# Patient Record
Sex: Male | Born: 1995 | Race: White | Hispanic: No | Marital: Single | Smoking: Never smoker
Health system: Southern US, Community
[De-identification: ages and names within clinical notes are randomized; demographics above are authoritative.]

## PROBLEM LIST (undated history)

## (undated) HISTORY — PX: VASECTOMY: SHX75

---
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
## 2016-03-01 ENCOUNTER — Ambulatory Visit (INDEPENDENT_AMBULATORY_CARE_PROVIDER_SITE_OTHER): Payer: PRIVATE HEALTH INSURANCE | Admitting: Psychiatry

## 2016-03-01 ENCOUNTER — Encounter (HOSPITAL_COMMUNITY): Payer: Self-pay | Admitting: Psychiatry

## 2016-03-01 ENCOUNTER — Encounter (INDEPENDENT_AMBULATORY_CARE_PROVIDER_SITE_OTHER): Payer: Self-pay

## 2016-03-01 VITALS — BP 116/74 | HR 72 | Ht 67.25 in | Wt 146.4 lb

## 2016-03-01 DIAGNOSIS — F4323 Adjustment disorder with mixed anxiety and depressed mood: Secondary | ICD-10-CM

## 2016-03-01 DIAGNOSIS — F1099 Alcohol use, unspecified with unspecified alcohol-induced disorder: Secondary | ICD-10-CM | POA: Diagnosis not present
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
## 2021-09-01 ENCOUNTER — Emergency Department
Admission: EM | Admit: 2021-09-01 | Discharge: 2021-09-01 | Disposition: A | Discharge status: Home / Self Care | Payer: 59 | Attending: Emergency Medicine | Admitting: Emergency Medicine

## 2021-09-01 ENCOUNTER — Emergency Department: Payer: 59

## 2021-09-01 ENCOUNTER — Other Ambulatory Visit: Payer: Self-pay

## 2021-09-01 DIAGNOSIS — R200 Anesthesia of skin: Secondary | ICD-10-CM | POA: Insufficient documentation

## 2021-09-01 DIAGNOSIS — R42 Dizziness and giddiness: Secondary | ICD-10-CM | POA: Diagnosis not present

## 2021-09-01 DIAGNOSIS — R202 Paresthesia of skin: Secondary | ICD-10-CM | POA: Diagnosis present

## 2021-09-01 LAB — DIFFERENTIAL
Abs Immature Granulocytes: 0.03 10*3/uL (ref 0.00–0.07)
Basophils Absolute: 0.1 10*3/uL (ref 0.0–0.1)
Basophils Relative: 1 %
Eosinophils Absolute: 0.2 10*3/uL (ref 0.0–0.5)
Eosinophils Relative: 3 %
Immature Granulocytes: 0 %
Lymphocytes Relative: 30 %
Lymphs Abs: 2.3 10*3/uL (ref 0.7–4.0)
Monocytes Absolute: 0.6 10*3/uL (ref 0.1–1.0)
Monocytes Relative: 8 %
Neutro Abs: 4.7 10*3/uL (ref 1.7–7.7)
Neutrophils Relative %: 58 %

## 2021-09-01 LAB — COMPREHENSIVE METABOLIC PANEL
ALT: 60 U/L — ABNORMAL HIGH (ref 0–44)
AST: 31 U/L (ref 15–41)
Albumin: 4.8 g/dL (ref 3.5–5.0)
Alkaline Phosphatase: 81 U/L (ref 38–126)
Anion gap: 8 (ref 5–15)
BUN: 17 mg/dL (ref 6–20)
CO2: 28 mmol/L (ref 22–32)
Calcium: 9.7 mg/dL (ref 8.9–10.3)
Chloride: 104 mmol/L (ref 98–111)
Creatinine, Ser: 0.94 mg/dL (ref 0.61–1.24)
GFR, Estimated: >60 mL/min (ref >60)
Glucose, Bld: 105 mg/dL — ABNORMAL HIGH (ref 70–99)
Potassium: 3.7 mmol/L (ref 3.5–5.1)
Sodium: 140 mmol/L (ref 135–145)
Total Bilirubin: 0.8 mg/dL (ref 0.3–1.2)
Total Protein: 8.2 g/dL — ABNORMAL HIGH (ref 6.5–8.1)

## 2021-09-01 LAB — CBC
HCT: 47 % (ref 39.0–52.0)
Hemoglobin: 15.5 g/dL (ref 13.0–17.0)
MCH: 27.9 pg (ref 26.0–34.0)
MCHC: 33 g/dL (ref 30.0–36.0)
MCV: 84.5 fL (ref 80.0–100.0)
Platelets: 317 10*3/uL (ref 150–400)
RBC: 5.56 MIL/uL (ref 4.22–5.81)
RDW: 13 % (ref 11.5–15.5)
WBC: 7.9 10*3/uL (ref 4.0–10.5)
nRBC: 0 % (ref 0.0–0.2)

## 2021-09-01 LAB — APTT: aPTT: 27 seconds (ref 24–36)

## 2021-09-01 LAB — PROTIME-INR
INR: 1 (ref 0.8–1.2)
Prothrombin Time: 13.1 seconds (ref 11.4–15.2)

## 2021-09-01 IMAGING — CT CT HEAD W/O CM
4 series · 16 of 47 positions shown, 18 images · non-contrast
Comparison: [DATE].

CLINICAL DATA: Patient complains of waking with numbness and
tingling in left side of face, arms and legs this morning.



[Series 2: head wo · axial · 0.40mm/px · z∈[-106,+10]mm · 7 of 31 slices shown, 9 images]
[im 4/31  brain]
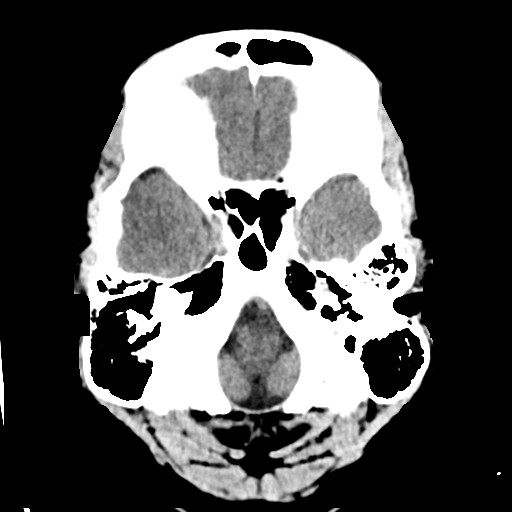
[im 4/31  bone]
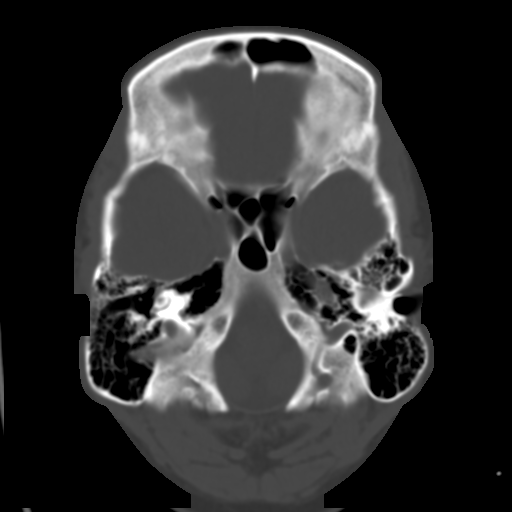
[im 8/31  brain]
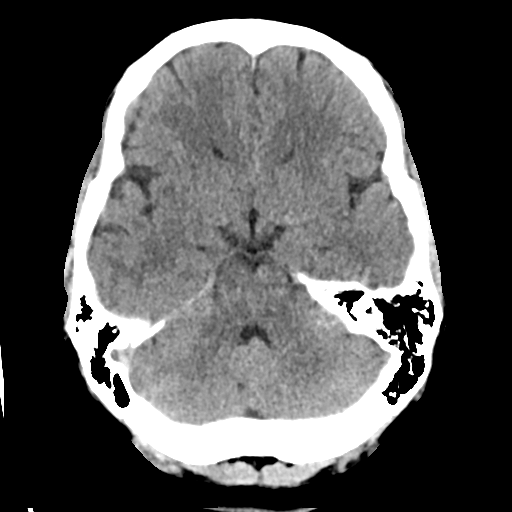
[im 12/31  brain]
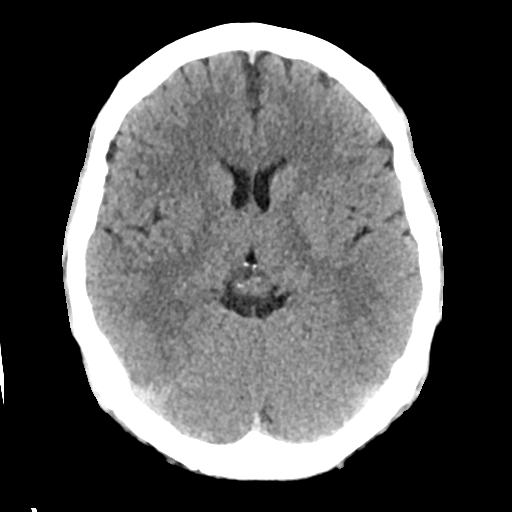
[im 16/31  brain]
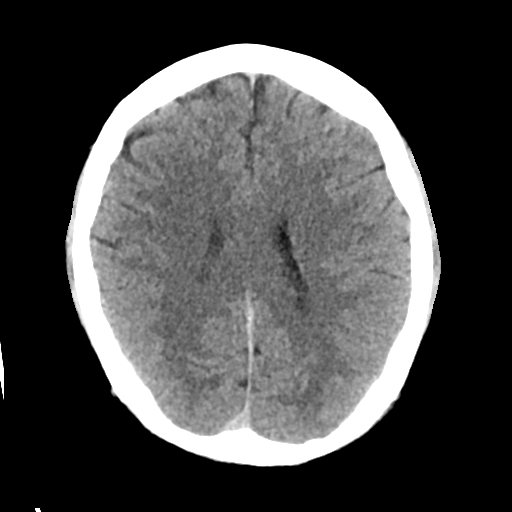
[im 19/31  brain]
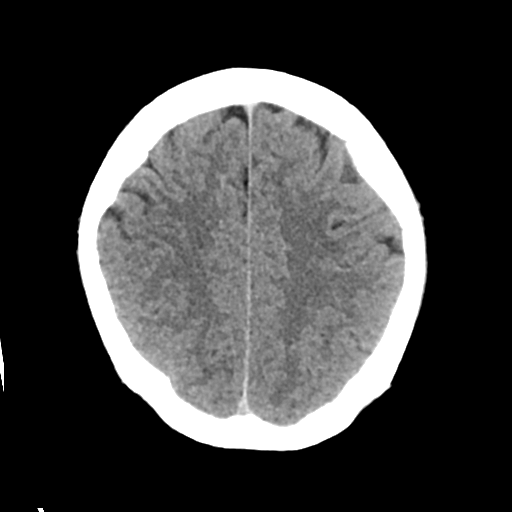
[im 19/31  bone]
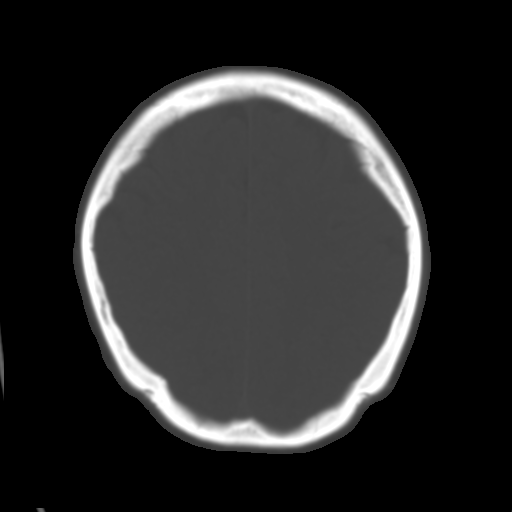
[im 23/31  brain]
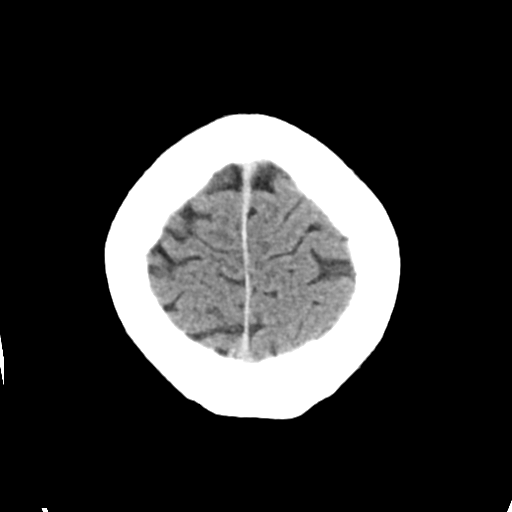
[im 27/31  brain]
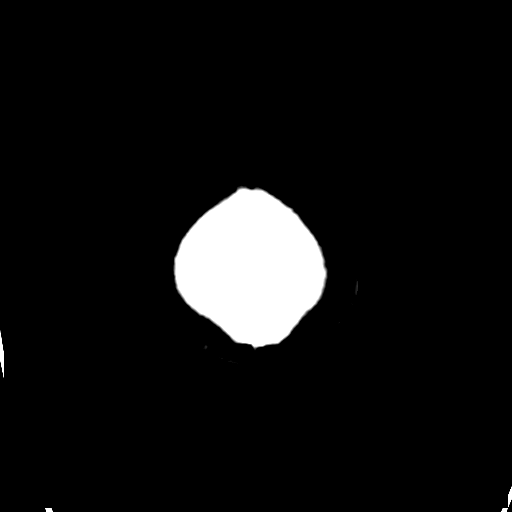

[Series 3: head bone · axial · 0.40mm/px · z∈[-106,-76]mm · 3 of 76 slices shown]
[im 8/76  bone]
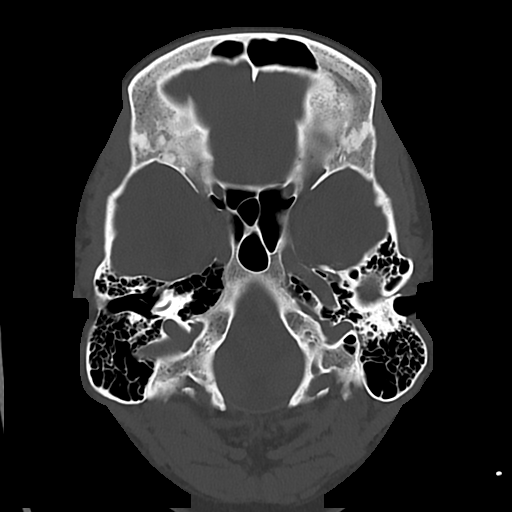
[im 16/76  bone]
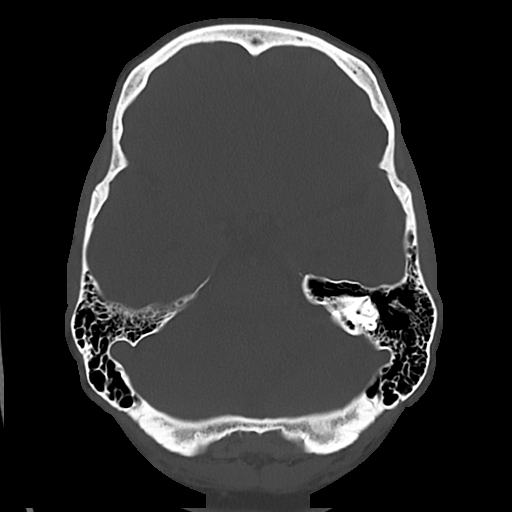
[im 23/76  bone]
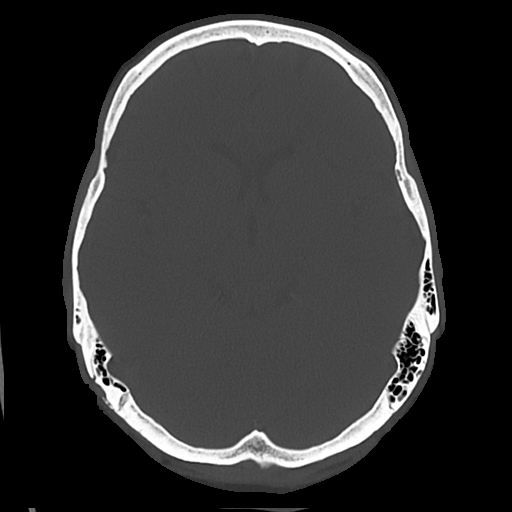

[Series 4: cor soft · coronal · 0.33mm/px · 3 of 67 slices shown]
[im 23/67  brain]
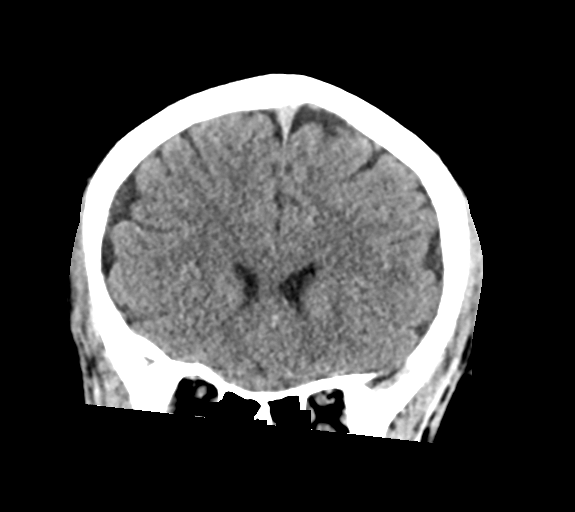
[im 30/67  brain]
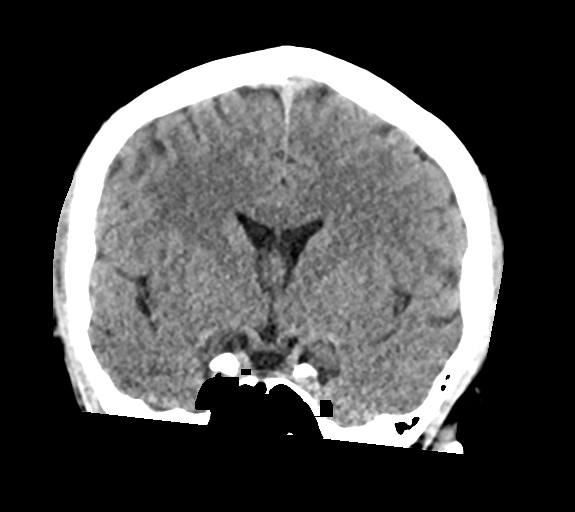
[im 37/67  brain]
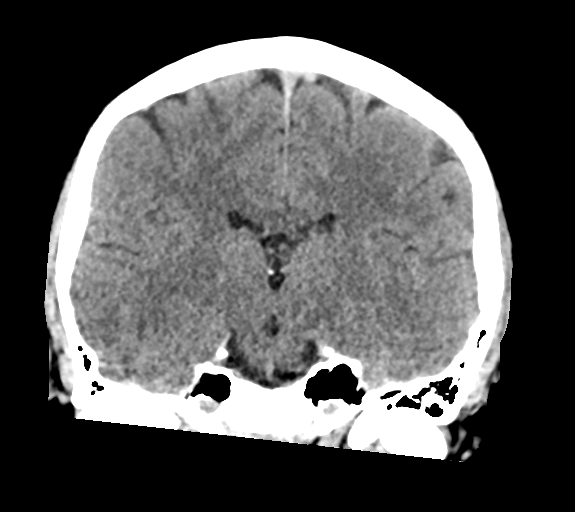

[Series 5: sag soft · sagittal · 0.33mm/px · 3 of 64 slices shown]
[im 23/64  brain]
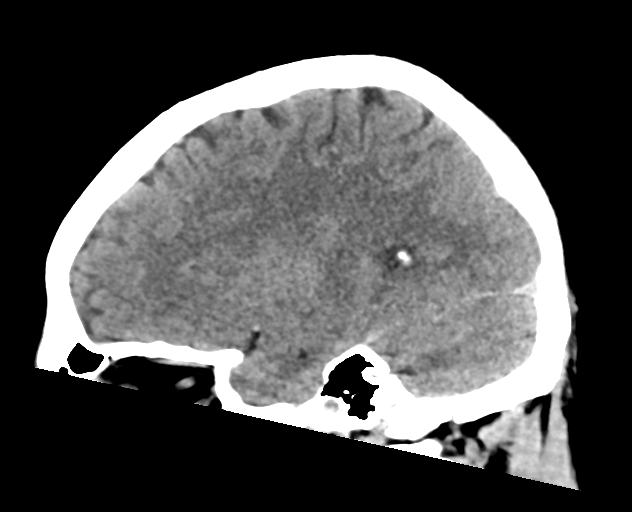
[im 32/64  brain]
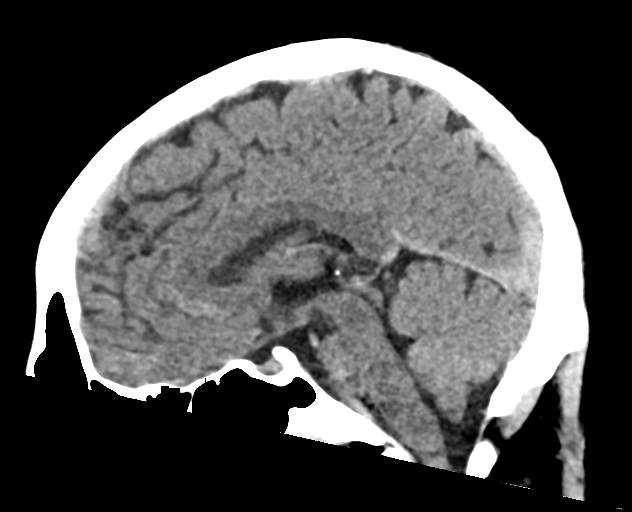
[im 42/64  brain]
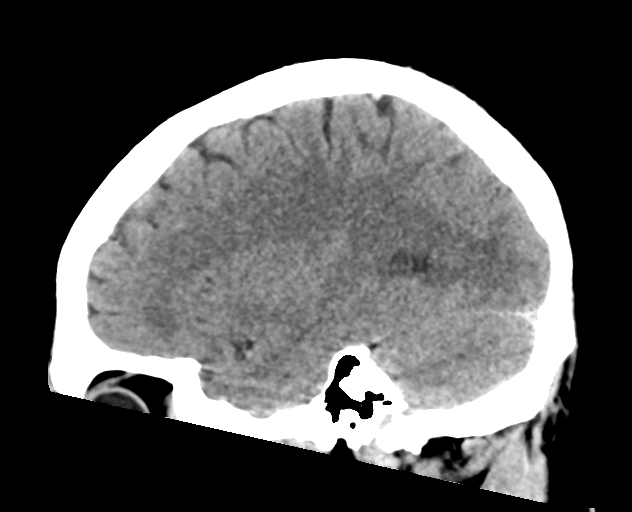

[16 of 47 positions shown; findings below may reference images not displayed]

FINDINGS: Brain: No evidence of acute infarction, hemorrhage, hydrocephalus,
extra-axial collection or mass lesion/mass effect.

Vascular: No hyperdense vessel or unexpected calcification.

Skull: Normal. Negative for fracture or focal lesion.

Sinuses/Orbits: No acute finding.

Other: None.
IMPRESSION: No acute intracranial abnormalities. Normal appearance of the brain.

## 2021-09-01 IMAGING — MR MR CERVICAL SPINE WO/W CM
5 of 8 series · 28 of 48 positions shown · IV contrast (gadavist)
Comparison: None.

CLINICAL DATA: Neuro deficit, acute, stroke suspected Acute L face,
arm, leg numbness in otherwise healthy young male, evaluate stroke,
MS, mass/lesion; Myelopathy, acute, cervical spine

EXAM:
MRI HEAD WITHOUT AND WITH CONTRAST
MRI CERVICAL SPINE WITHOUT AND WITH CONTRAST
CONTRAST:  7.5mL GADAVIST GADOBUTROL 1 MMOL/ML IV SOLN
TECHNIQUE: Multiplanar, multiecho pulse sequences of the brain and surrounding
structures and cervical spine were obtained without and with
intravenous contrast.

[Series 5: T2 · sagittal · 3.0mm · 0.62mm/px · 4 of 15 slices shown (1 of 2)]
[im 1/15]
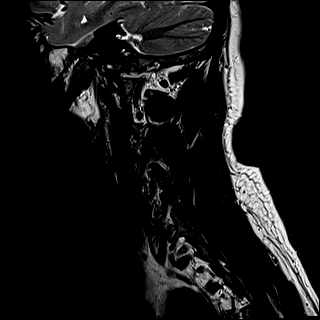
[im 5/15]
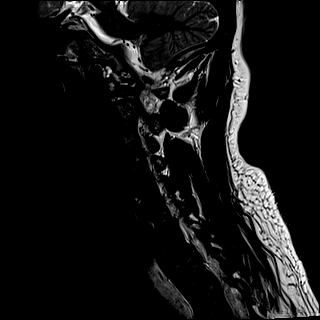
[im 10/15]
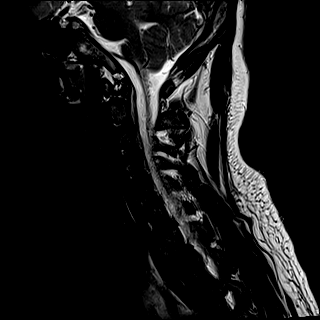
[im 15/15]
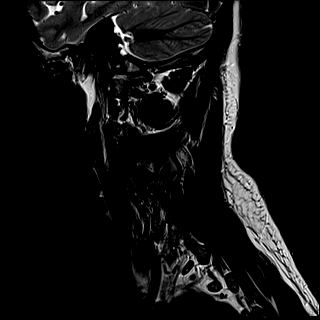

[Series 7: STIR · sagittal · 3.0mm · 0.62mm/px · 4 of 15 slices shown]
[im 1/15]
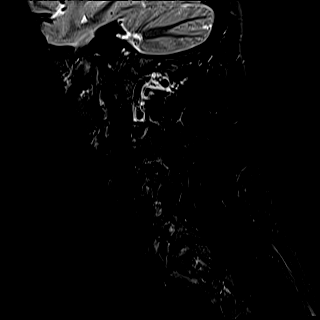
[im 5/15]
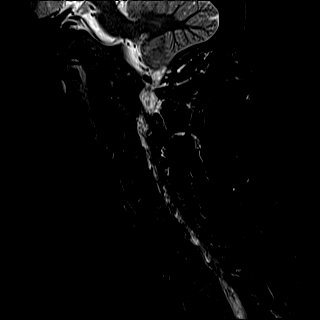
[im 10/15]
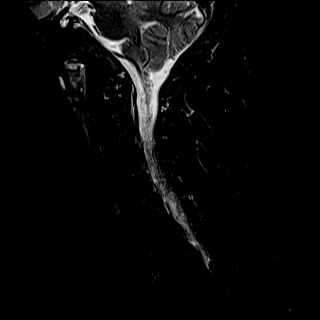
[im 15/15]
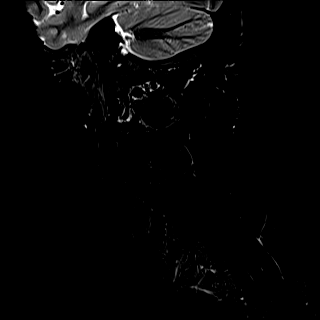

[Series 8: T2 · axial · 3.0mm · 0.70mm/px · z∈[-209,-108]mm · 8 of 33 slices shown (2 of 2)]
[im 1/33]
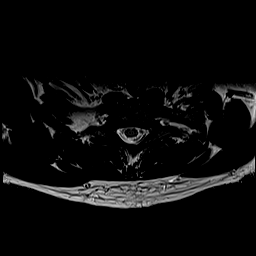
[im 5/33]
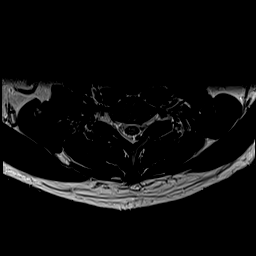
[im 10/33]
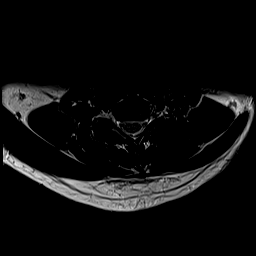
[im 14/33]
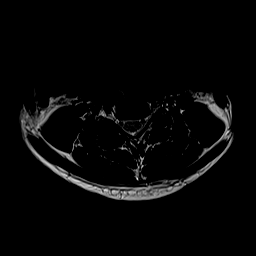
[im 19/33]
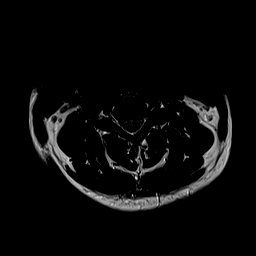
[im 23/33]
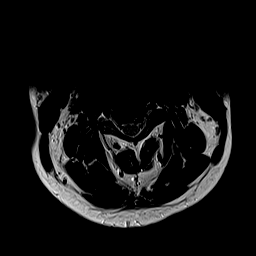
[im 28/33]
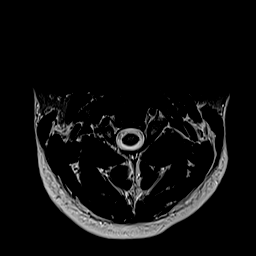
[im 33/33]
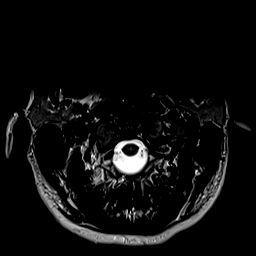

[Series 10: T1 · axial · non-contrast · 3.0mm · 0.35mm/px · z∈[-209,-108]mm · 8 of 33 slices shown]
[im 1/33]
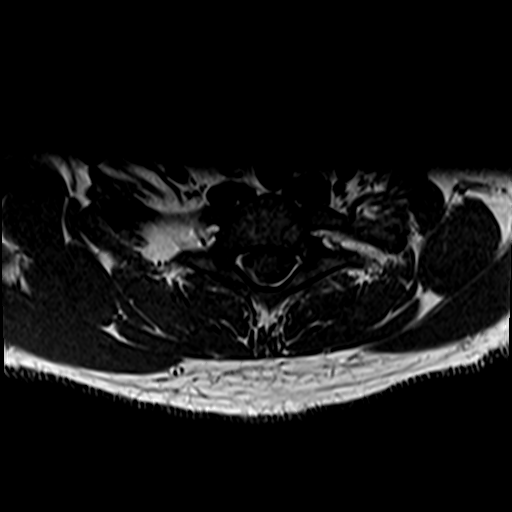
[im 5/33]
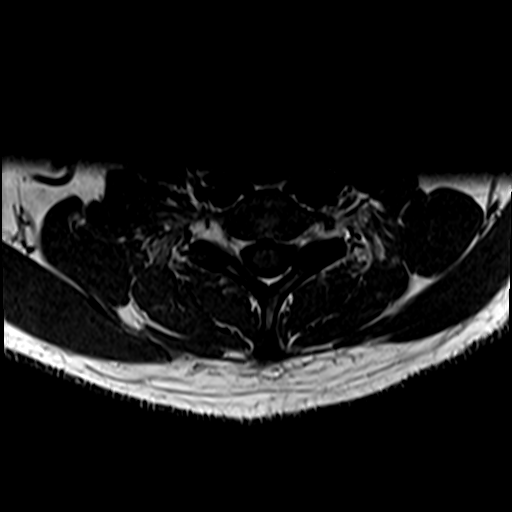
[im 10/33]
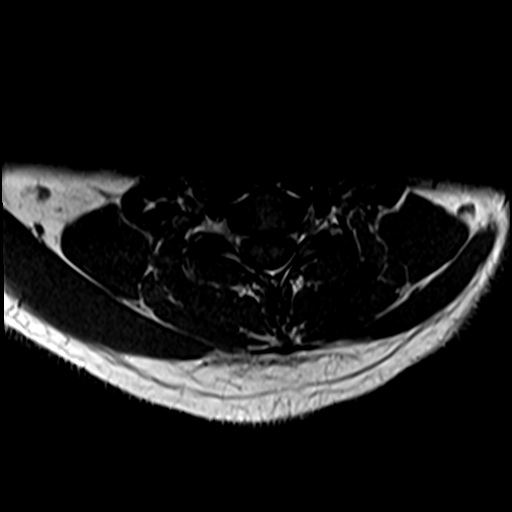
[im 14/33]
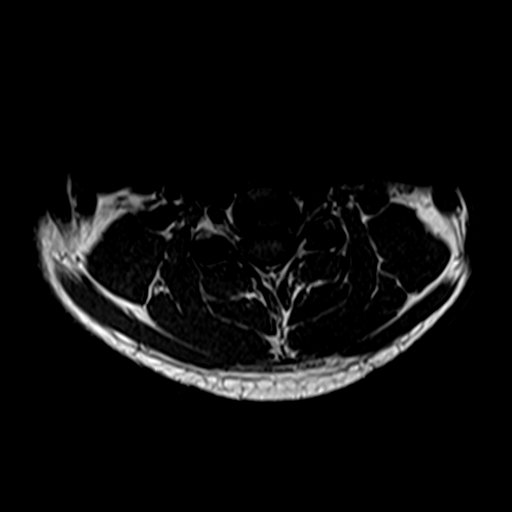
[im 19/33]
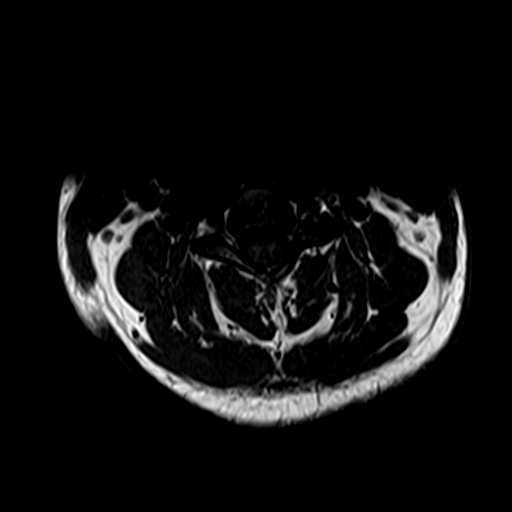
[im 23/33]
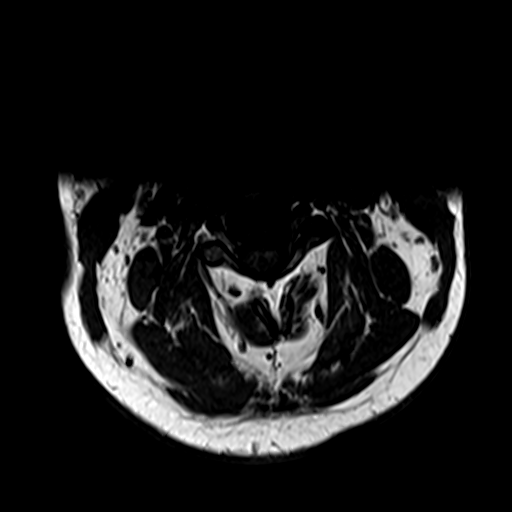
[im 28/33]
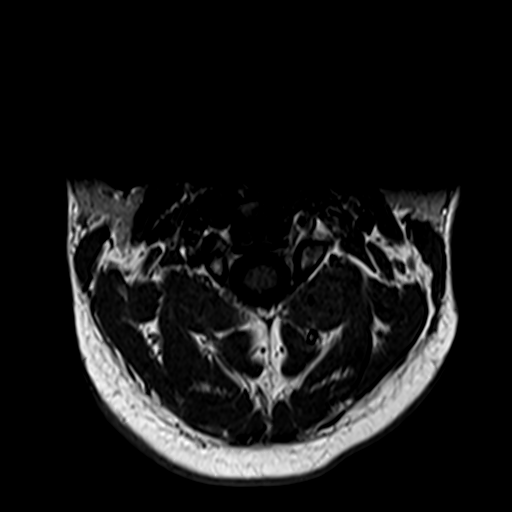
[im 33/33]
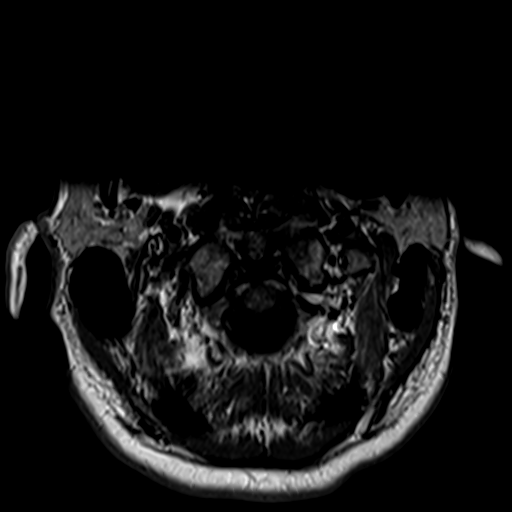

[Series 12: T1 post-contrast · axial · 3.0mm · 0.35mm/px · z∈[-209,-168]mm · 4 of 33 slices shown]
[im 1/33]
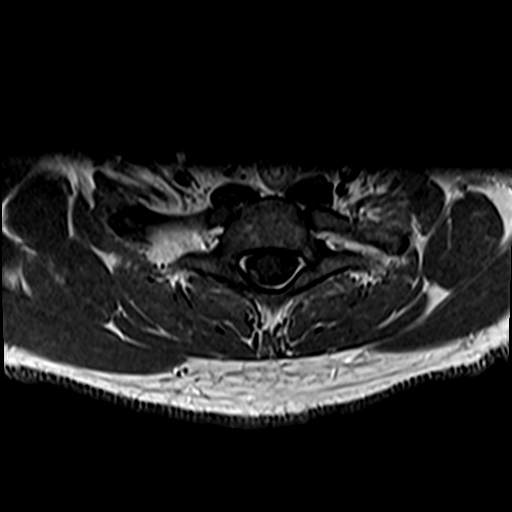
[im 5/33]
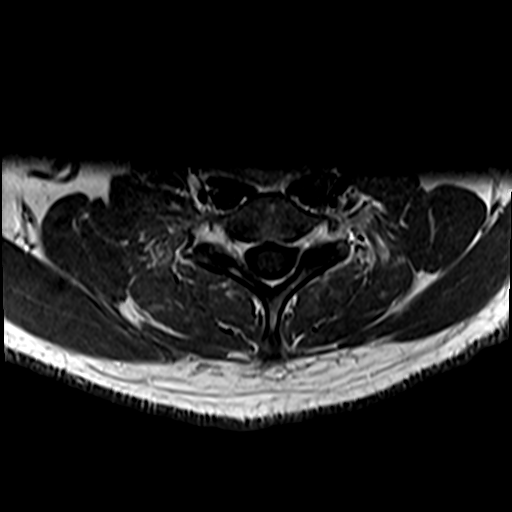
[im 10/33]
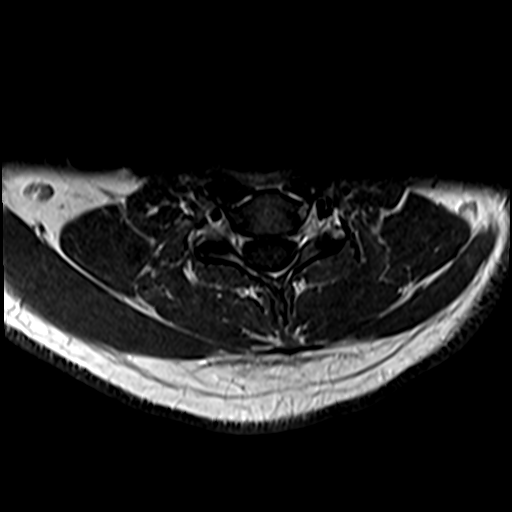
[im 14/33]
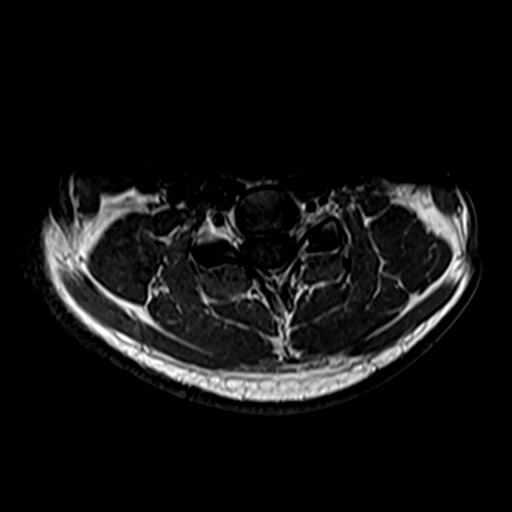

[28 of 48 positions shown; findings below may reference images not displayed]

FINDINGS: MRI HEAD FINDINGS

Brain: No acute infarction, hemorrhage, hydrocephalus, extra-axial
collection or mass lesion. Minimal punctate T2/FLAIR
hyperintensities in the white matter. No abnormal enhancement.

Vascular: Major arterial flow voids are maintained at the skull
base.

Skull and upper cervical spine: Normal marrow signal.

Sinuses/Orbits: Mild paranasal sinus mucosal thickening. No acute
orbital findings.

Other: No mastoid effusions.

MRI CERVICAL SPINE FINDINGS

Alignment: Normal.

Vertebrae: Vertebral body heights are maintained. No focal marrow
edema to suggest acute fracture discitis/osteomyelitis. No
suspicious bone lesions.

Cord: Normal cord signal.  No abnormal cord enhancement.

Posterior Fossa, vertebral arteries, paraspinal tissues: Visualized
vertebral artery flow voids are maintained. No paraspinal edema.

Disc levels:

C2-C3: No significant disc protrusion, foraminal stenosis, or canal
stenosis.

C3-C4: Small posterior disc osteophyte complex with mild bilateral
facet and uncovertebral hypertrophy. No significant canal or
foraminal stenosis

C4-C5: Small posterior disc osteophyte complex. No significant canal
or foraminal stenosis.

C5-C6: Small posterior disc osteophyte complex and mild left
uncovertebral hypertrophy. No significant canal or foraminal
stenosis.

C6-C7: No significant disc protrusion, foraminal stenosis, or canal
stenosis.

C7-T1: No significant disc protrusion, foraminal stenosis, or canal
stenosis.
IMPRESSION: MRI head:

1. No evidence of acute intracranial abnormality.
2. Minimal punctate T2/FLAIR hyperintensities within the white
matter, which are nonspecific but potentially secondary to chronic
microvascular ischemic disease. Demyelination is thought less
likely. No abnormal enhancement.

MRI cervical spine:

1. Normal cord signal.  No abnormal cord enhancement.
2. Mild multilevel degenerative change without significant stenosis.

## 2021-09-01 IMAGING — MR MR HEAD WO/W CM
15 series · 48 of 48 positions shown · IV contrast (gadavist)
Comparison: None.

CLINICAL DATA: Neuro deficit, acute, stroke suspected Acute L face,
arm, leg numbness in otherwise healthy young male, evaluate stroke,
MS, mass/lesion; Myelopathy, acute, cervical spine

EXAM:
MRI HEAD WITHOUT AND WITH CONTRAST
MRI CERVICAL SPINE WITHOUT AND WITH CONTRAST
CONTRAST:  7.5mL GADAVIST GADOBUTROL 1 MMOL/ML IV SOLN
TECHNIQUE: Multiplanar, multiecho pulse sequences of the brain and surrounding
structures and cervical spine were obtained without and with
intravenous contrast.

[Series 5: ax dwi_tracew · axial · 3.0mm · 0.71mm/px · z∈[-95,+68]mm · 4 of 56 slices shown]
[im 1/56]
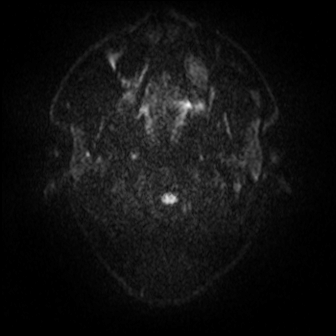
[im 19/56]
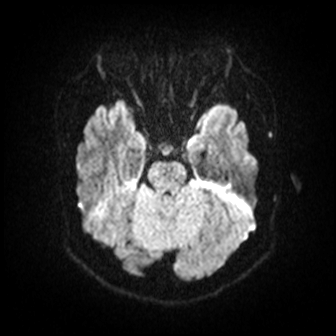
[im 37/56]
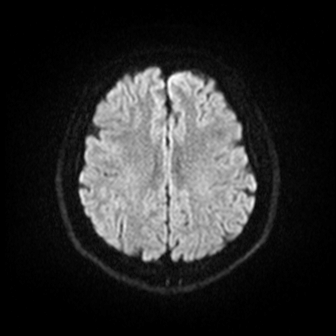
[im 56/56]
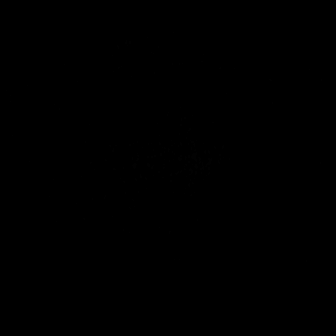

[Series 6: ax dwi_adc · axial · 3.0mm · 0.71mm/px · z∈[-95,+62]mm · 4 of 54 slices shown]
[im 1/54]
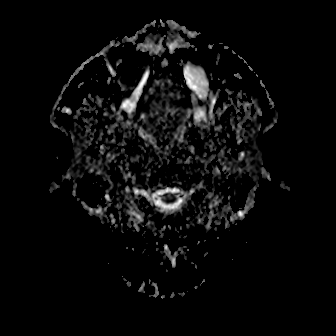
[im 18/54]
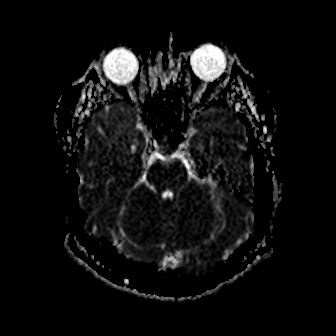
[im 36/54]
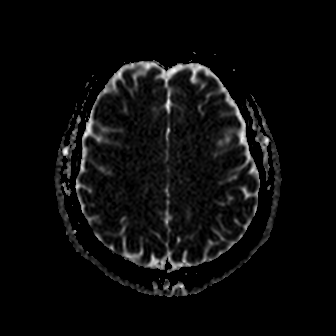
[im 54/54]
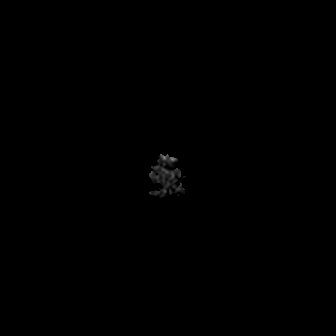

[Series 7: cor dwi_tracew · coronal · 5.0mm · 0.68mm/px · 2 of 38 slices shown]
[im 1/38]
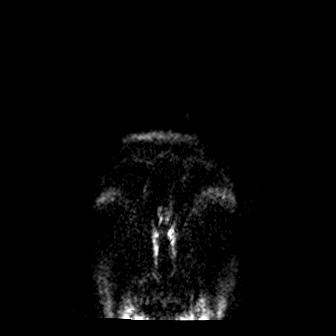
[im 38/38]
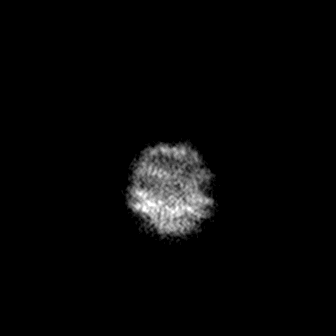

[Series 8: cor dwi_adc · coronal · 5.0mm · 0.68mm/px · 2 of 38 slices shown]
[im 1/38]
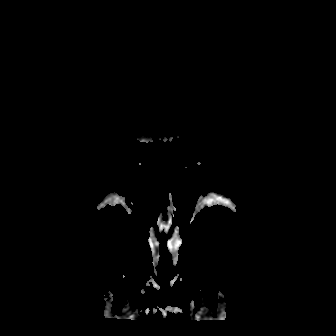
[im 38/38]
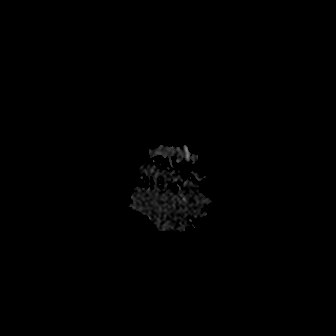

[Series 9: T1 · sagittal · 5.0mm · 0.47mm/px · 1 of 24 slices shown (1 of 2)]
[im 1/24]
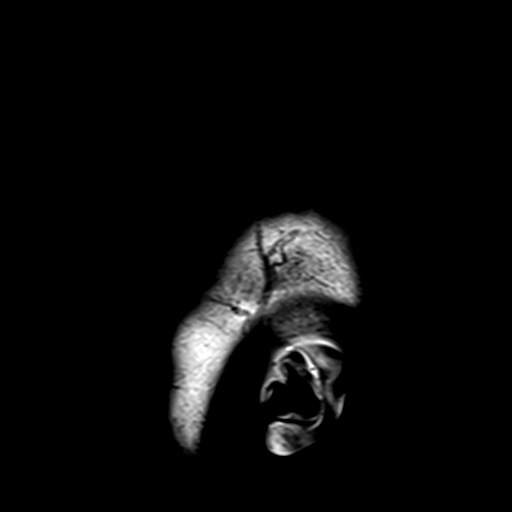

[Series 10: FLAIR · sagittal · 5.0mm · 0.94mm/px · 1 of 23 slices shown (1 of 2)]
[im 1/23]
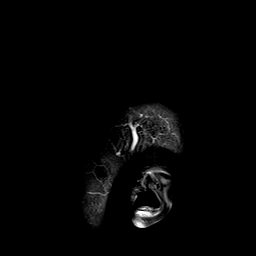

[Series 11: T2 · axial · 5.0mm · 0.86mm/px · z∈[-94,+60]mm · 2 of 27 slices shown (1 of 2)]
[im 1/27]
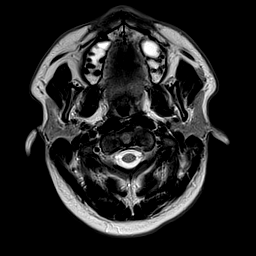
[im 27/27]
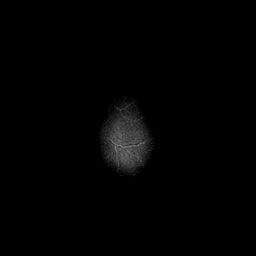

[Series 13: ax swi_pha · axial · 3.0mm · 0.90mm/px · z∈[-99,+64]mm · 3 of 56 slices shown]
[im 1/56]
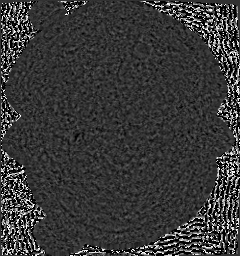
[im 28/56]
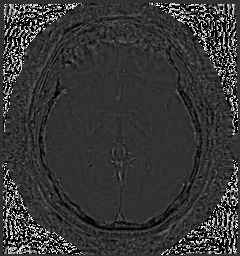
[im 56/56]
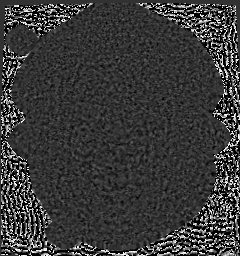

[Series 14: ax swi_swi · axial · 3.0mm · 0.90mm/px · z∈[-99,+64]mm · 3 of 56 slices shown]
[im 1/56]
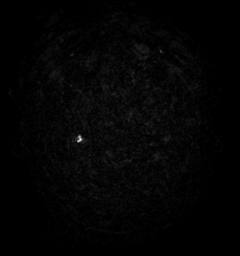
[im 28/56]
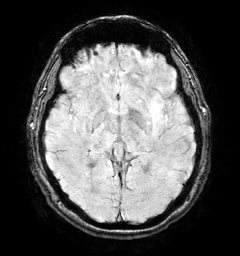
[im 56/56]
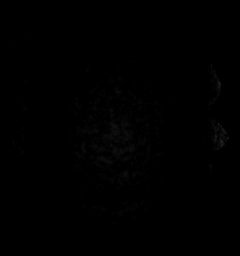

[Series 16: FLAIR · axial · 3.0mm · 0.69mm/px · z∈[-97,+63]mm · 3 of 55 slices shown (2 of 2)]
[im 1/55]
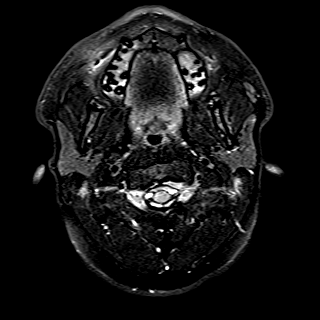
[im 28/55]
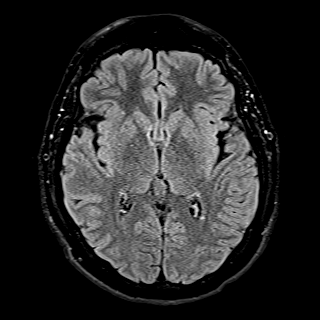
[im 55/55]
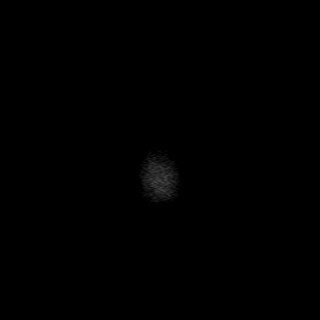

[Series 17: T1 · axial · 1.0mm · 0.98mm/px · z∈[-98,+59]mm · 9 of 160 slices shown (2 of 2)]
[im 1/160]
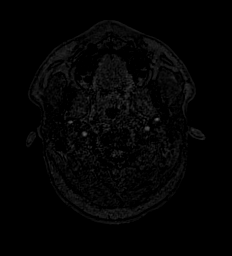
[im 20/160]
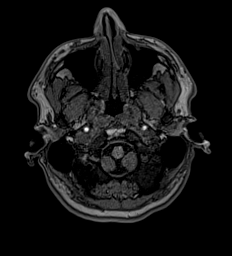
[im 40/160]
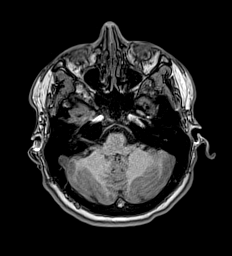
[im 60/160]
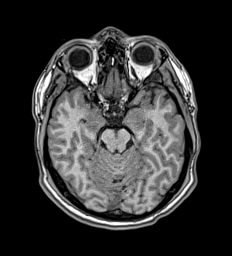
[im 80/160]
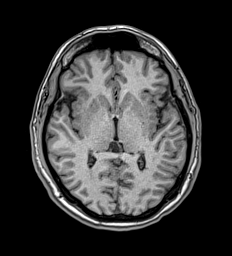
[im 100/160]
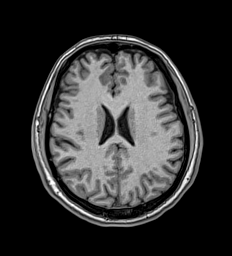
[im 120/160]
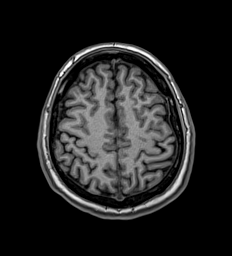
[im 140/160]
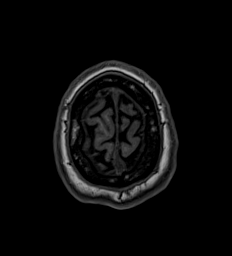
[im 160/160]
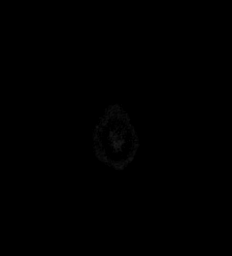

[Series 18: T2 · coronal · 5.0mm · 0.86mm/px · 2 of 29 slices shown (2 of 2)]
[im 1/29]
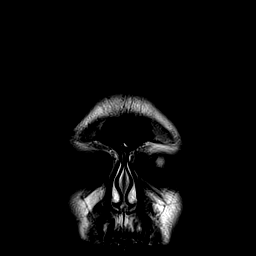
[im 29/29]
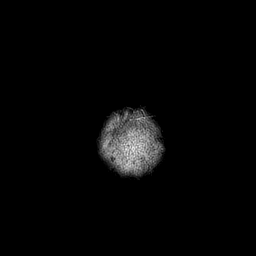

[Series 19: T1 post-contrast · axial · 1.0mm · 0.98mm/px · z∈[-98,+59]mm · 9 of 160 slices shown (1 of 3)]
[im 1/160]
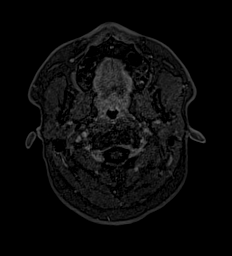
[im 20/160]
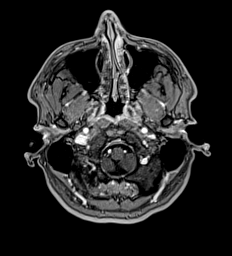
[im 40/160]
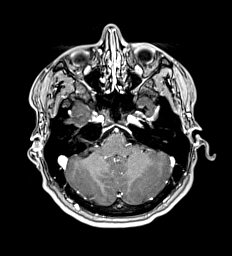
[im 60/160]
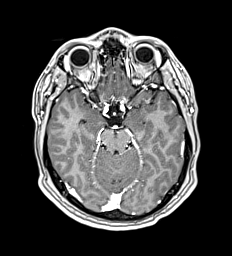
[im 80/160]
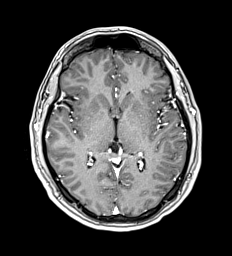
[im 100/160]
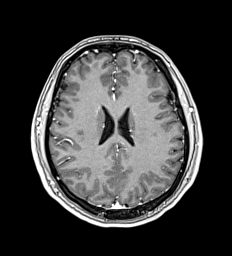
[im 120/160]
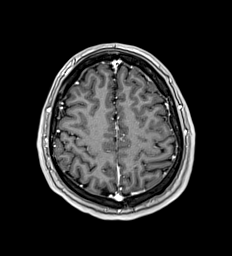
[im 140/160]
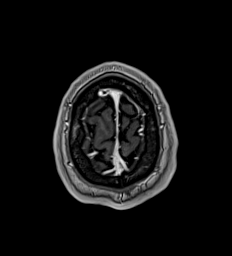
[im 160/160]
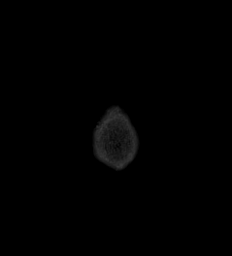

[Series 20: T1 post-contrast · coronal · 5.0mm · 0.43mm/px · 2 of 30 slices shown (2 of 3)]
[im 1/30]
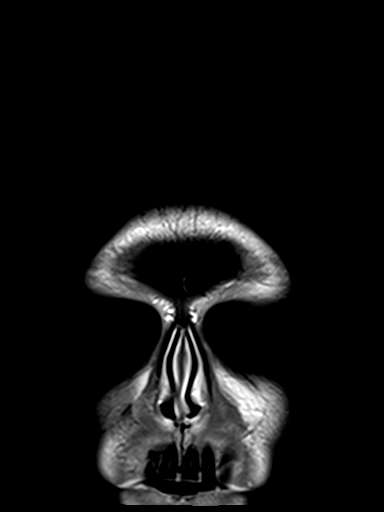
[im 30/30]
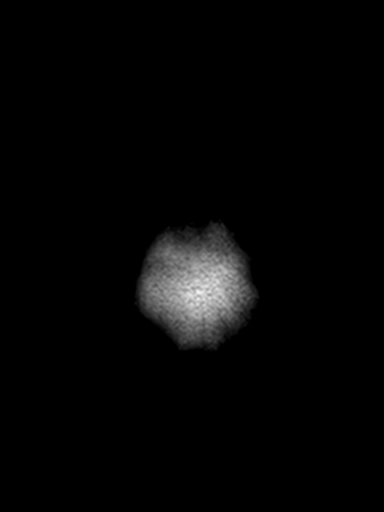

[Series 21: T1 post-contrast · sagittal · 5.0mm · 0.47mm/px · 1 of 24 slices shown (3 of 3)]
[im 1/24]
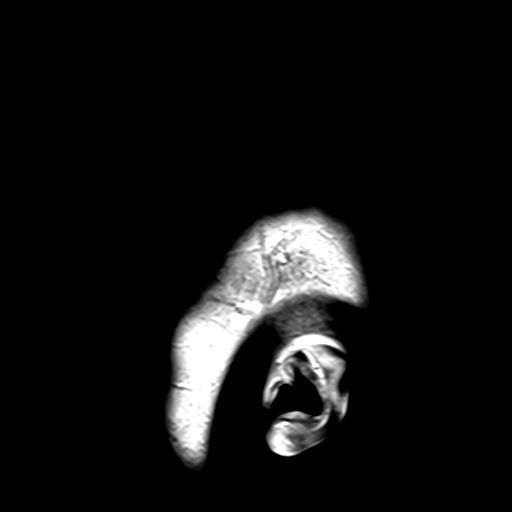

[48 of 48 positions shown; findings below may reference images not displayed]

FINDINGS: MRI HEAD FINDINGS

Brain: No acute infarction, hemorrhage, hydrocephalus, extra-axial
collection or mass lesion. Minimal punctate T2/FLAIR
hyperintensities in the white matter. No abnormal enhancement.

Vascular: Major arterial flow voids are maintained at the skull
base.

Skull and upper cervical spine: Normal marrow signal.

Sinuses/Orbits: Mild paranasal sinus mucosal thickening. No acute
orbital findings.

Other: No mastoid effusions.

MRI CERVICAL SPINE FINDINGS

Alignment: Normal.

Vertebrae: Vertebral body heights are maintained. No focal marrow
edema to suggest acute fracture discitis/osteomyelitis. No
suspicious bone lesions.

Cord: Normal cord signal.  No abnormal cord enhancement.

Posterior Fossa, vertebral arteries, paraspinal tissues: Visualized
vertebral artery flow voids are maintained. No paraspinal edema.

Disc levels:

C2-C3: No significant disc protrusion, foraminal stenosis, or canal
stenosis.

C3-C4: Small posterior disc osteophyte complex with mild bilateral
facet and uncovertebral hypertrophy. No significant canal or
foraminal stenosis

C4-C5: Small posterior disc osteophyte complex. No significant canal
or foraminal stenosis.

C5-C6: Small posterior disc osteophyte complex and mild left
uncovertebral hypertrophy. No significant canal or foraminal
stenosis.

C6-C7: No significant disc protrusion, foraminal stenosis, or canal
stenosis.

C7-T1: No significant disc protrusion, foraminal stenosis, or canal
stenosis.
IMPRESSION: MRI head:

1. No evidence of acute intracranial abnormality.
2. Minimal punctate T2/FLAIR hyperintensities within the white
matter, which are nonspecific but potentially secondary to chronic
microvascular ischemic disease. Demyelination is thought less
likely. No abnormal enhancement.

MRI cervical spine:

1. Normal cord signal.  No abnormal cord enhancement.
2. Mild multilevel degenerative change without significant stenosis.

## 2021-09-01 MED ORDER — PROCHLORPERAZINE EDISYLATE 10 MG/2ML IJ SOLN
10.0000 mg | Freq: Once | INTRAMUSCULAR | Status: AC
  Administered 2021-09-01: 10 mg via INTRAVENOUS
  Filled 2021-09-01: qty 2

## 2021-09-01 MED ORDER — KETOROLAC TROMETHAMINE 30 MG/ML IJ SOLN
15.0000 mg | Freq: Once | INTRAMUSCULAR | Status: AC
  Administered 2021-09-01: 15 mg via INTRAVENOUS
  Filled 2021-09-01: qty 1

## 2021-09-01 MED ORDER — NAPROXEN 375 MG PO TABS: 375.0000 mg | ORAL_TABLET | Freq: Two times a day (BID) | ORAL | 0 refills | Status: AC

## 2021-09-01 MED ORDER — GADOBUTROL 1 MMOL/ML IV SOLN
7.5000 mL | Freq: Once | INTRAVENOUS | Status: AC | PRN
  Administered 2021-09-01: 7.5 mL via INTRAVENOUS
  Filled 2021-09-01: qty 7.5

## 2021-09-01 MED ORDER — SODIUM CHLORIDE 0.9% FLUSH: 3.0000 mL | Freq: Once | INTRAVENOUS | Status: DC

## 2021-09-01 MED ORDER — DIPHENHYDRAMINE HCL 50 MG/ML IJ SOLN
25.0000 mg | Freq: Once | INTRAMUSCULAR | Status: AC
  Administered 2021-09-01: 25 mg via INTRAVENOUS
  Filled 2021-09-01: qty 1

## 2021-09-01 NOTE — ED Notes (Signed)
Patient was restless, appeared anxious and wanted IV out. IV was removed. Patient was diaphoretic and pale, but recovered to normal complexion. Dr. Erma Heritage aware. Patient is resting on stretcher at this time. ?

## 2021-09-01 NOTE — Discharge Instructions (Addendum)
As we discussed, your MRI and work-up was very reassuring today.  That being said, I am referring you to neurology for close outpatient follow-up.  Call the number provided to see Dr. Melrose Nakayama, Dr. Brigitte Pulse, or one of their assistance within the next 1 to 2 weeks ideally. ? ?For now, we are going to try scheduled anti-inflammatories if there is an inflammatory component to this.  Take the Naprosyn with meals twice a day for 5 days. ? ?Try to monitor what makes her symptoms better or worse. ? ?Return with any new or concerning symptoms. ?

## 2021-09-01 NOTE — ED Provider Notes (Signed)
? ?[facility] ?Provider Note ? ? ? Event Date/Time  ? First MD Initiated Contact with Patient 09/01/21 1034   ?  (approximate) ? ? ?History  ? ?Numbness ? ? ?HPI ? ?Caleb Arnold is a 26 y.o. male here with acute onset left-sided numbness.  The patient states that he initially began to feel somewhat unwell last night.  He states he began to feel somewhat lightheaded and generally weak last night.  He did not have any numbness though.  He went to bed, and when he awoke, he noticed significant numbness along the left side of his face, arms, and legs.  It began in his leg, then his face, then his arm.  It is persisted since onset.  He does not believe he has had any weakness.  No vision changes.  No photophobia.  No neck pain.  No recent trauma.  No recent chiropractic manipulation.  Denies any history of similar symptoms.  No history of migraines and he denies any headache.  Symptoms feel fairly persistent since onset.  They have not necessarily worsened.  Denies any ataxia.  No history of similar neurological issues.  No family history neurological issues. ?  ? ? ?Physical Exam  ? ?Triage Vital Signs: ?ED Triage Vitals  ?Enc Vitals Group  ?   BP 09/01/21 1020 (!) 143/95  ?   Pulse Rate 09/01/21 1020 (!) 115  ?   Resp 09/01/21 1020 17  ?   Temp 09/01/21 1020 98.3 ?F (36.8 ?C)  ?   Temp Source 09/01/21 1020 Oral  ?   SpO2 09/01/21 1020 95 %  ?   Weight 09/01/21 1021 178 lb (80.7 kg)  ?   Height 09/01/21 1021 5\' 8"  (1.727 m)  ?   Head Circumference --   ?   Peak Flow --   ?   Pain Score 09/01/21 1021 0  ?   Pain Loc --   ?   Pain Edu? --   ?   Excl. in GC? --   ? ? ?Most recent vital signs: ?Vitals:  ? 09/01/21 1343 09/01/21 1345  ?BP: 126/86 126/86  ?Pulse: (!) 103 92  ?Resp: 18 13  ?Temp:    ?SpO2: 96% 96%  ? ? ? ?General: Awake, no distress.  ?CV:  Good peripheral perfusion.  ?Resp:  Normal effort.  ?Abd:  No distention.  ?Other:  Diminished sensation along the left hemiface, upper  extremity, and left lower extremity.  Strength out of 5 bilateral upper extremities though subjectively slightly weaker on the left.  Cranial nerves II through XII intact.  Gait normal.  No dysmetria. ? ? ?ED Results / Procedures / Treatments  ? ?Labs ?(all labs ordered are listed, but only abnormal results are displayed) ?Labs Reviewed  ?COMPREHENSIVE METABOLIC PANEL - Abnormal; Notable for the following components:  ?    Result Value  ? Glucose, Bld 105 (*)   ? Total Protein 8.2 (*)   ? ALT 60 (*)   ? All other components within normal limits  ?PROTIME-INR  ?APTT  ?CBC  ?DIFFERENTIAL  ?CBG MONITORING, ED  ? ? ? ?EKG ?Sinus tachycardia, trickle rate 114.  PR 142, QRS 78, QTc 449.  No acute ST elevations or depressions.  No EKG evidence of acute ischemia or infarct. ? ? ?RADIOLOGY ?MRI brain/C-spine: Minimal punctate T2/FLAIR hyperintensities, nonspecific, mild multilevel degenerative changes of the C-spine ?CT head: No acute intracranial abnormality ? ? ?I also independently reviewed and agree  with radiologist interpretations. ? ? ?PROCEDURES: ? ?Critical Care performed: No ? ? ?MEDICATIONS ORDERED IN ED: ?Medications  ?sodium chloride flush (NS) 0.9 % injection 3 mL (3 mLs Intravenous Not Given 09/01/21 1031)  ?gadobutrol (GADAVIST) 1 MMOL/ML injection 7.5 mL (7.5 mLs Intravenous Contrast Given 09/01/21 1221)  ?diphenhydrAMINE (BENADRYL) injection 25 mg (25 mg Intravenous Given 09/01/21 1325)  ?ketorolac (TORADOL) 30 MG/ML injection 15 mg (15 mg Intravenous Given 09/01/21 1327)  ?prochlorperazine (COMPAZINE) injection 10 mg (10 mg Intravenous Given 09/01/21 1329)  ? ? ? ?IMPRESSION / MDM / ASSESSMENT AND PLAN / ED COURSE  ?I reviewed the triage vital signs and the nursing notes. ?             ?               ? ? ? ?Ddx:  ?Atypical migraine, peripheral neuropathy, stroke, mass/lesion, demyelinating process such as multiple sclerosis ? ? ?MDM:  ?26 year old male here with acute left-sided numbness.  Patient has no  weakness.  Neurological exam is otherwise nonfocal.  No history of neurological issues.  Patient is overall nontoxic and well-appearing.  CBC, CMP unremarkable.  He has no headache, photophobia, phonophobia, or signs to suggest meningitis, encephalitis, or infectious issue.  Given his age and symptoms concerning for focal lesion, CT was initially obtained followed by MRI to evaluate for demyelinating process.  MRI is overall very reassuring.  There is a read of questionable nonspecific T2 intensities, but I discussed this with Dr. Selina Cooley of neurology, who does not feel this is likely a demyelinating process, and likely is artifact.  Additionally, patient symptoms are mildly improving.  She recommends treating for possible complex migraine with close outpatient neurology follow-up which I think is reasonable given otherwise reassuring exam.  Will refer for outpatient neuro follow-up.  Return precautions given.. ? ? ?MEDICATIONS GIVEN IN ED: ?Medications  ?sodium chloride flush (NS) 0.9 % injection 3 mL (3 mLs Intravenous Not Given 09/01/21 1031)  ?gadobutrol (GADAVIST) 1 MMOL/ML injection 7.5 mL (7.5 mLs Intravenous Contrast Given 09/01/21 1221)  ?diphenhydrAMINE (BENADRYL) injection 25 mg (25 mg Intravenous Given 09/01/21 1325)  ?ketorolac (TORADOL) 30 MG/ML injection 15 mg (15 mg Intravenous Given 09/01/21 1327)  ?prochlorperazine (COMPAZINE) injection 10 mg (10 mg Intravenous Given 09/01/21 1329)  ? ? ? ?Consults:  ?Discussed via telephone with Dr. Selina Cooley, neurology ? ? ?EMR reviewed  ? ? ? ? ? ?FINAL CLINICAL IMPRESSION(S) / ED DIAGNOSES  ? ?Final diagnoses:  ?Paresthesia  ?Numbness  ? ? ? ?Rx / DC Orders  ? ?ED Discharge Orders   ? ?      Ordered  ?  naproxen (NAPROSYN) 375 MG tablet  2 times daily with meals       ? 09/01/21 1327  ? ?  ?  ? ?  ? ? ? ?Note:  This document was prepared using Dragon voice recognition software and may include unintentional dictation errors. ?  ?Shaune Pollack, MD ?09/01/21 1403 ? ?

## 2021-09-01 NOTE — ED Notes (Signed)
See triage note  presents with some numbness to left arm     states he developed this sx's last pm  states he had some tingling to face last pm   ?

## 2021-09-01 NOTE — ED Triage Notes (Signed)
Pt c/o waking with numbness and tingling to the left side of face, arm and leg this morning, states last night he had some ringing in his ears and just felt tense. Pt is ambulatory with a steady gait, no noted deficits at this time. ?

## 2021-09-01 NOTE — ED Notes (Signed)
Feels much better  skin w/d  color good   ?

## 2021-09-01 NOTE — ED Notes (Signed)
Patient was taken to MRI and IV will be started there. MRI tech transported the patient via wheelchair. Patient was able to independently and easily get off of stretcher and in wheelchair with no c/o discomfort.  ?

## 2023-04-24 ENCOUNTER — Other Ambulatory Visit
Admission: RE | Admit: 2023-04-24 | Discharge: 2023-04-24 | Disposition: A | Discharge status: Home / Self Care | Payer: 59 | Source: Ambulatory Visit | Attending: Family Medicine | Admitting: Family Medicine

## 2023-04-24 DIAGNOSIS — R079 Chest pain, unspecified: Secondary | ICD-10-CM | POA: Diagnosis present

## 2023-04-24 LAB — TROPONIN I (HIGH SENSITIVITY): Troponin I (High Sensitivity): 3 ng/L (ref <18)

## 2023-04-24 LAB — D-DIMER, QUANTITATIVE: D-Dimer, Quant: <0.27 ug{FEU}/mL (ref 0.00–0.50)
